# Patient Record
Sex: Male | Born: 1955 | Race: White | Hispanic: No | Marital: Married | Smoking: Current every day smoker
Health system: Southern US, Community
[De-identification: ages and names within clinical notes are randomized; demographics above are authoritative.]

## PROBLEM LIST (undated history)

## (undated) DIAGNOSIS — I10 Essential (primary) hypertension: Secondary | ICD-10-CM

## (undated) DIAGNOSIS — E119 Type 2 diabetes mellitus without complications: Secondary | ICD-10-CM

## (undated) HISTORY — PX: CHOLECYSTECTOMY: SHX55

---

## 2010-10-04 ENCOUNTER — Emergency Department (HOSPITAL_BASED_OUTPATIENT_CLINIC_OR_DEPARTMENT_OTHER)
Admission: EM | Admit: 2010-10-04 | Discharge: 2010-10-04 | Disposition: A | Discharge status: Home / Self Care | Payer: BC Managed Care – PPO | Attending: Emergency Medicine | Admitting: Emergency Medicine

## 2010-10-04 ENCOUNTER — Emergency Department (INDEPENDENT_AMBULATORY_CARE_PROVIDER_SITE_OTHER): Payer: BC Managed Care – PPO

## 2010-10-04 DIAGNOSIS — F29 Unspecified psychosis not due to a substance or known physiological condition: Secondary | ICD-10-CM

## 2010-10-04 DIAGNOSIS — R059 Cough, unspecified: Secondary | ICD-10-CM

## 2010-10-04 DIAGNOSIS — R05 Cough: Secondary | ICD-10-CM

## 2010-10-04 DIAGNOSIS — Z79899 Other long term (current) drug therapy: Secondary | ICD-10-CM | POA: Insufficient documentation

## 2010-10-04 DIAGNOSIS — I10 Essential (primary) hypertension: Secondary | ICD-10-CM | POA: Insufficient documentation

## 2010-10-04 DIAGNOSIS — R5381 Other malaise: Secondary | ICD-10-CM | POA: Insufficient documentation

## 2010-10-04 DIAGNOSIS — E119 Type 2 diabetes mellitus without complications: Secondary | ICD-10-CM | POA: Insufficient documentation

## 2010-10-04 LAB — COMPREHENSIVE METABOLIC PANEL
ALT: 45 U/L (ref 0–53)
Albumin: 4.4 g/dL (ref 3.5–5.2)
Calcium: 9 mg/dL (ref 8.4–10.5)
Glucose, Bld: 189 mg/dL — ABNORMAL HIGH (ref 70–99)
Potassium: 3.8 mEq/L (ref 3.5–5.1)
Sodium: 139 mEq/L (ref 135–145)
Total Protein: 7.5 g/dL (ref 6.0–8.3)

## 2010-10-04 LAB — POCT CARDIAC MARKERS
Myoglobin, poc: 72.8 ng/mL (ref 12–200)
Myoglobin, poc: 64.9 ng/mL (ref 12–200)
Troponin i, poc: <0.05 ng/mL (ref 0.00–0.09)
Troponin i, poc: <0.05 ng/mL (ref 0.00–0.09)

## 2010-10-04 LAB — CBC
MCV: 92.3 fL (ref 78.0–100.0)
Platelets: 190 10*3/uL (ref 150–400)
RBC: 4.93 MIL/uL (ref 4.22–5.81)
RDW: 12 % (ref 11.5–15.5)
WBC: 9 10*3/uL (ref 4.0–10.5)

## 2010-10-04 LAB — URINALYSIS, ROUTINE W REFLEX MICROSCOPIC
Bilirubin Urine: NEGATIVE
Glucose, UA: 250 mg/dL — AB
Ketones, ur: NEGATIVE mg/dL
Protein, ur: NEGATIVE mg/dL
pH: 7 (ref 5.0–8.0)

## 2010-10-04 LAB — GLUCOSE, CAPILLARY: Glucose-Capillary: 195 mg/dL — ABNORMAL HIGH (ref 70–99)

## 2010-10-04 LAB — LIPASE, BLOOD: Lipase: 115 U/L (ref 23–300)

## 2010-10-04 LAB — HEMOCCULT GUIAC POC 1CARD (OFFICE): Fecal Occult Bld: NEGATIVE

## 2011-09-17 IMAGING — CR DG CHEST 2V
2 series · 2 of 2 positions shown · non-contrast
Comparison: None.

CLINICAL DATA: Weakness/cough/confusion

CHEST - 2 VIEW

[w chest pa]
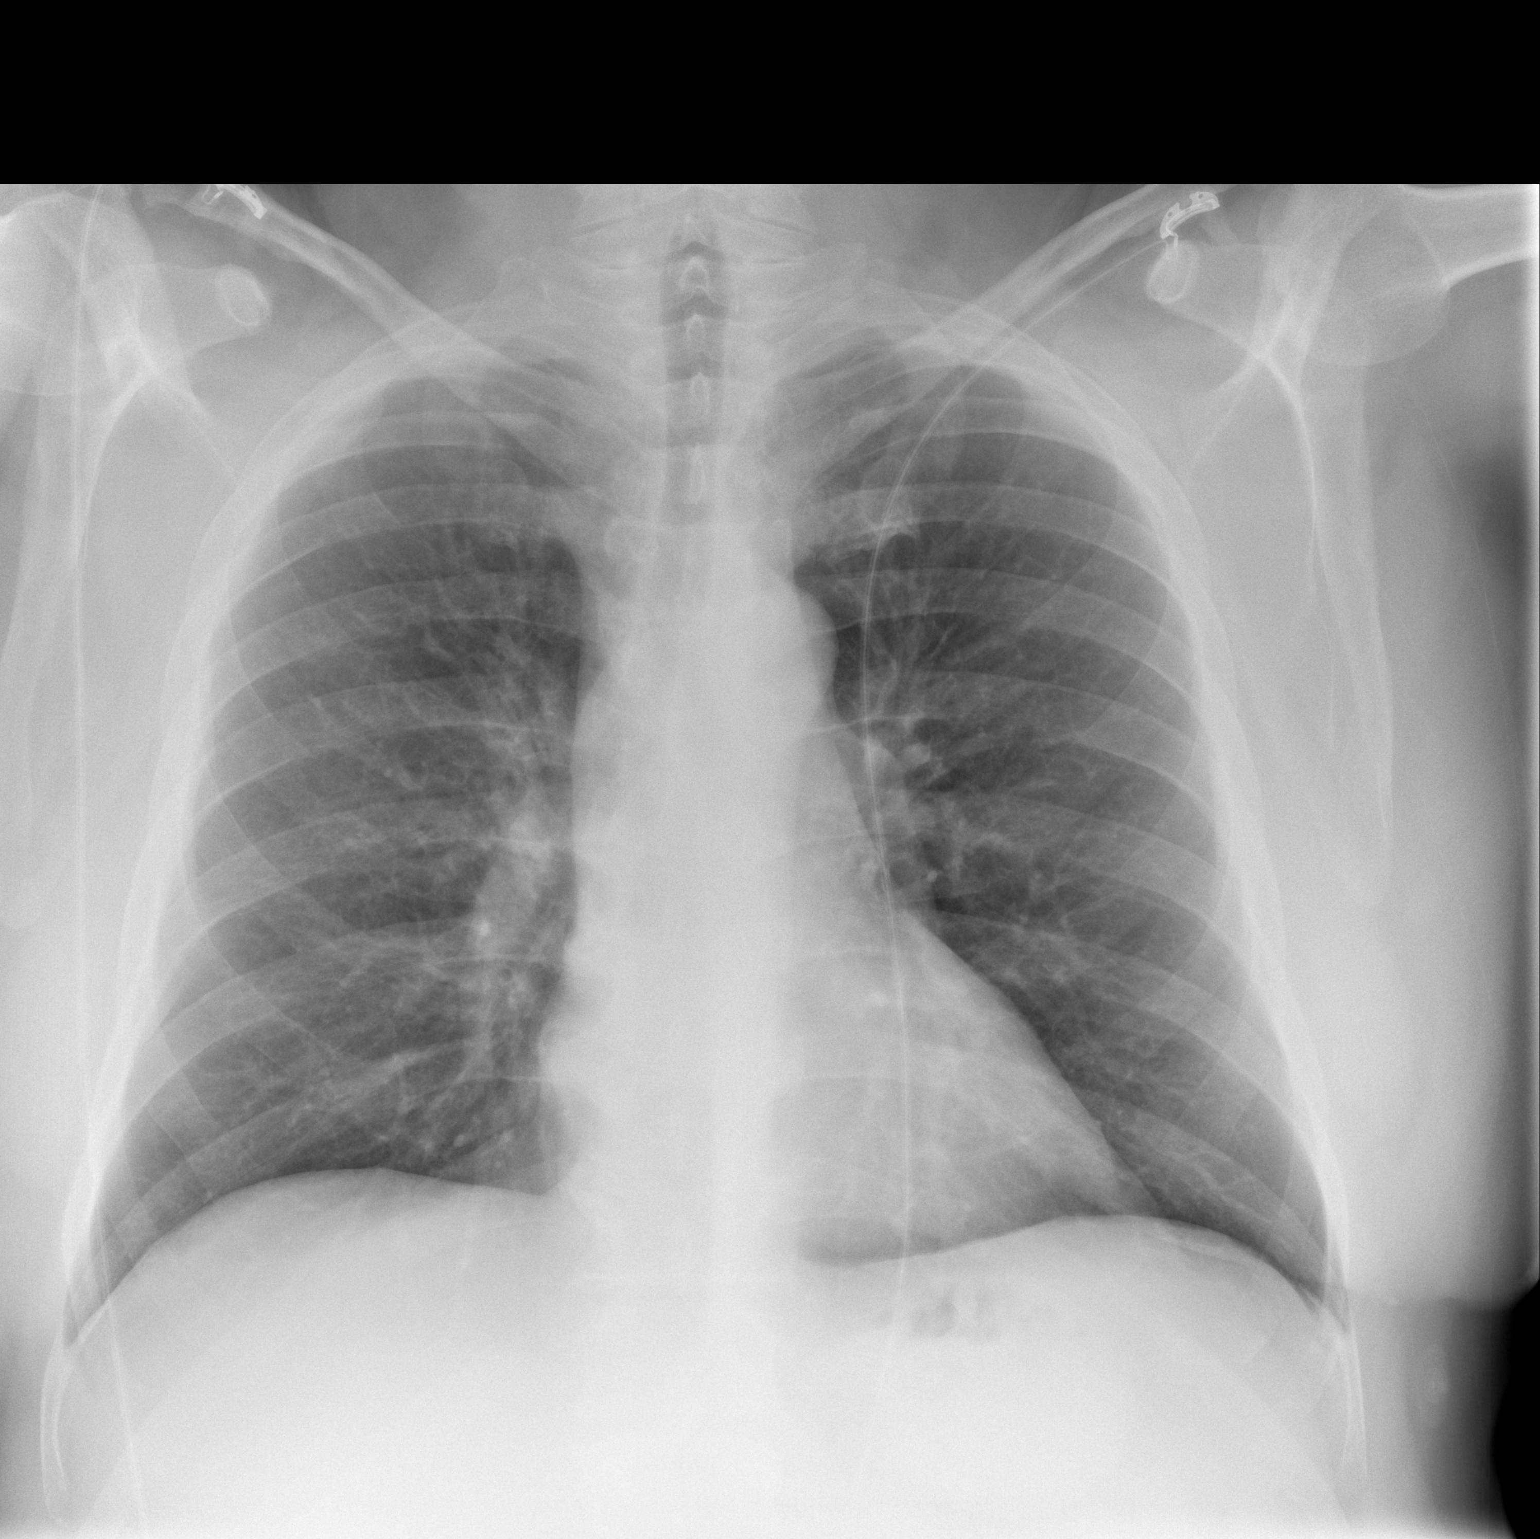

[w chest lat]
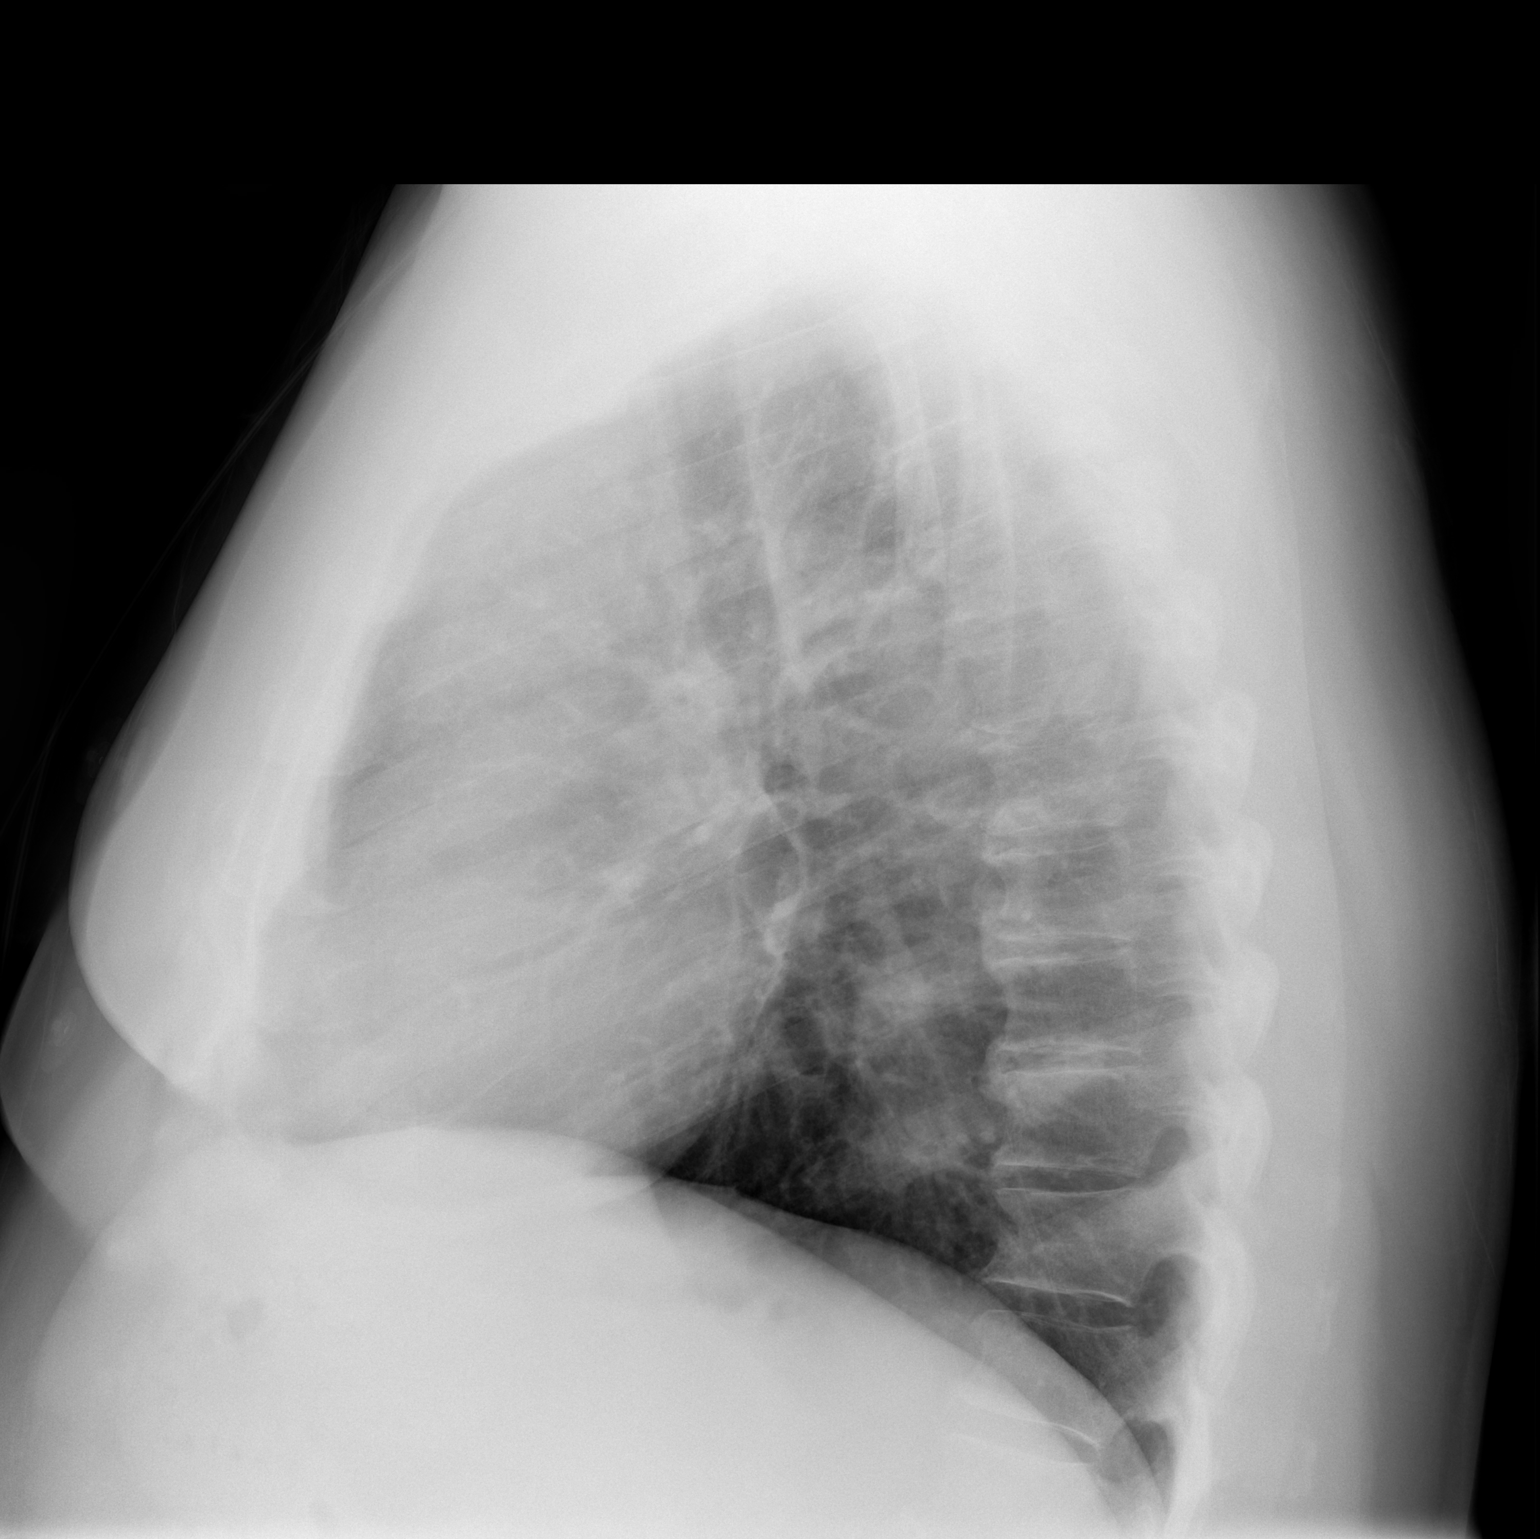

[2 of 2 positions shown; findings below may reference images not displayed]

FINDINGS: Heart and mediastinal contours normal.  Lungs clear.  No
pleural fluid.  Osseous structures and soft tissues unremarkable.

There are moderate degenerative changes of the thoracic spine.
IMPRESSION: No active disease.

## 2021-02-18 ENCOUNTER — Encounter (HOSPITAL_BASED_OUTPATIENT_CLINIC_OR_DEPARTMENT_OTHER): Payer: Self-pay | Admitting: Emergency Medicine

## 2021-02-18 ENCOUNTER — Other Ambulatory Visit: Payer: Self-pay

## 2021-02-18 ENCOUNTER — Emergency Department (HOSPITAL_BASED_OUTPATIENT_CLINIC_OR_DEPARTMENT_OTHER)
Admission: EM | Admit: 2021-02-18 | Discharge: 2021-02-18 | Disposition: A | Discharge status: Home / Self Care | Payer: Medicare Other | Attending: Emergency Medicine | Admitting: Emergency Medicine

## 2021-02-18 DIAGNOSIS — R059 Cough, unspecified: Secondary | ICD-10-CM | POA: Insufficient documentation

## 2021-02-18 DIAGNOSIS — E119 Type 2 diabetes mellitus without complications: Secondary | ICD-10-CM | POA: Insufficient documentation

## 2021-02-18 DIAGNOSIS — R062 Wheezing: Secondary | ICD-10-CM | POA: Insufficient documentation

## 2021-02-18 DIAGNOSIS — F17210 Nicotine dependence, cigarettes, uncomplicated: Secondary | ICD-10-CM | POA: Insufficient documentation

## 2021-02-18 DIAGNOSIS — Z20822 Contact with and (suspected) exposure to covid-19: Secondary | ICD-10-CM | POA: Insufficient documentation

## 2021-02-18 DIAGNOSIS — I10 Essential (primary) hypertension: Secondary | ICD-10-CM | POA: Diagnosis not present

## 2021-02-18 HISTORY — DX: Type 2 diabetes mellitus without complications: E11.9

## 2021-02-18 HISTORY — DX: Essential (primary) hypertension: I10

## 2021-02-18 NOTE — ED Triage Notes (Signed)
Pt sts wife tested + for Covid today; sts he has a dry cough and wants to be tested as well

## 2021-02-18 NOTE — Discharge Instructions (Signed)
Please read and follow all provided instructions.  Your diagnoses today include:  1. Cough with exposure to COVID-19 virus     Tests performed today include: Vital signs. See below for your results today.  COVID test - pending, check mychart for results  Medications prescribed:  None  Take any prescribed medications only as directed. Treatment for your infection is aimed at treating the symptoms. There are no medications, such as antibiotics, that will cure your infection.   Home care instructions:  Follow any educational materials contained in this packet.   Your illness is contagious and can be spread to others, especially during the first 3 or 4 days. It cannot be cured by antibiotics or other medicines. Take basic precautions such as washing your hands often, covering your mouth when you cough or sneeze, and avoiding public places where you could spread your illness to others.   Please continue drinking plenty of fluids.  Use over-the-counter medicines as needed as directed on packaging for symptom relief.  You may also use ibuprofen or tylenol as directed on packaging for pain or fever.  Do not take multiple medicines containing Tylenol or acetaminophen to avoid taking too much of this medication.  If you are positive for Covid-19, you should isolate yourself and not be exposed to other people for 5 days after your symptoms began. If you are not feeling better at day 5, you need to isolate yourself for a total of 10 days. If you are feeling better by day 5, you should wear a mask properly, over your nose and mouth, at all times while around other people until 10 days after your symptoms started.   Follow-up instructions: Please follow-up with your primary care provider as needed for further evaluation of your symptoms if you are not feeling better.   I will call in a prescription for the antiviral medication Paxlovid assuming your test is positive.   Return instructions:  Please  return to the Emergency Department if you experience worsening symptoms.  Return to the emergency department if you have worsening shortness of breath breathing or increased work of breathing, persistent vomiting RETURN IMMEDIATELY IF you develop shortness of breath, confusion or altered mental status, a new rash, become dizzy, faint, or poorly responsive, or are unable to be cared for at home. Please return if you have persistent vomiting and cannot keep down fluids or develop a fever that is not controlled by tylenol or motrin.   Please return if you have any other emergent concerns.  Additional Information:  Your vital signs today were: BP (!) 145/74   Pulse 87   Temp 98.5 F (36.9 C) (Oral)   Resp 20   Ht 6' (1.829 m)   Wt 131.5 kg   SpO2 97%   BMI 39.33 kg/m  If your blood pressure (BP) was elevated above 135/85 this visit, please have this repeated by your doctor within one month. --------------

## 2021-02-18 NOTE — ED Provider Notes (Signed)
[facility] EMERGENCY DEPARTMENT Provider Note   CSN: [id number] Arrival date & time: 02/18/21  1723     History Chief Complaint  Patient presents with   Cough    Brian Meyer is a 65 y.o. male.  Patient presents the emergency department for evaluation of cough.  Patient has been exposed to his wife who developed a fever and tested positive for COVID today on an at home test.  He reports dry, nonproductive cough.  He attributed this to smoking.  States he has a history of diabetes for which she takes a medication.  He states that the remainder of his lab work performed by his PCP recently looked good.  He does not have a history of chronic kidney disease per his report.  No fevers, sore throat, vomiting or diarrhea.        Past Medical History:  Diagnosis Date   Diabetes mellitus without complication (HCC)    Hypertension     There are no problems to display for this patient.   Past Surgical History:  Procedure Laterality Date   CHOLECYSTECTOMY         No family history on file.  Social History   Tobacco Use   Smoking status: Every Day    Types: Cigarettes   Smokeless tobacco: Never    Home Medications Prior to Admission medications   Not on File    Allergies    Patient has no known allergies.  Review of Systems   Review of Systems  Constitutional:  Negative for chills, fatigue and fever.  HENT:  Negative for congestion, ear pain, rhinorrhea, sinus pressure and sore throat.   Eyes:  Negative for redness.  Respiratory:  Positive for cough. Negative for shortness of breath and wheezing.   Gastrointestinal:  Negative for abdominal pain, diarrhea, nausea and vomiting.  Genitourinary:  Negative for dysuria.  Musculoskeletal:  Negative for myalgias and neck stiffness.  Skin:  Negative for rash.  Neurological:  Negative for headaches.  Hematological:  Negative for adenopathy.   Physical Exam Updated Vital Signs BP (!) 145/74   Pulse 87   Temp  98.5 F (36.9 C) (Oral)   Resp 20   Ht 6' (1.829 m)   Wt 131.5 kg   SpO2 97%   BMI 39.33 kg/m   Physical Exam Vitals and nursing note reviewed.  Constitutional:      Appearance: He is well-developed.  HENT:     Head: Normocephalic and atraumatic.     Jaw: No trismus.     Right Ear: External ear normal.     Left Ear: External ear normal.     Nose: Nose normal. No mucosal edema or rhinorrhea.     Mouth/Throat:     Mouth: Mucous membranes are not dry.     Pharynx: Uvula midline. No oropharyngeal exudate, posterior oropharyngeal erythema or uvula swelling.     Tonsils: No tonsillar abscesses.  Eyes:     General:        Right eye: No discharge.        Left eye: No discharge.     Conjunctiva/sclera: Conjunctivae normal.  Cardiovascular:     Rate and Rhythm: Normal rate and regular rhythm.  Pulmonary:     Effort: Pulmonary effort is normal. No respiratory distress.     Breath sounds: Wheezing (minimal scattered wheezing) present. No rales.  Abdominal:     Palpations: Abdomen is soft.     Tenderness: There is no abdominal tenderness.  Musculoskeletal:  Cervical back: Normal range of motion and neck supple.  Skin:    General: Skin is warm and dry.  Neurological:     Mental Status: He is alert.    ED Results / Procedures / Treatments   Labs (all labs ordered are listed, but only abnormal results are displayed) Labs Reviewed  SARS CORONAVIRUS 2 (TAT 6-24 HRS)    EKG None  Radiology No results found.  Procedures Procedures   Medications Ordered in ED Medications - No data to display  ED Course  I have reviewed the triage vital signs and the nursing notes.  Pertinent labs & imaging results that were available during my care of the patient were reviewed by me and considered in my medical decision making (see chart for details).  Patient seen and examined. COVID test -- will follow result. Discussed antiviral. He has higher-risk, DM, smoking, age > 27. Will rx  if positive.   Vital signs reviewed and are as follows: BP (!) 145/74   Pulse 87   Temp 98.5 F (36.9 C) (Oral)   Resp 20   Ht 6' (1.829 m)   Wt 131.5 kg   SpO2 97%   BMI 39.33 kg/m   Brian Meyer was evaluated in Emergency Department on 02/18/2021 for the symptoms described in the history of present illness. He was evaluated in the context of the global COVID-19 pandemic, which necessitated consideration that the patient might be at risk for infection with the SARS-CoV-2 virus that causes COVID-19. Institutional protocols and algorithms that pertain to the evaluation of patients at risk for COVID-19 are in a state of rapid change based on information released by regulatory bodies including the CDC and federal and state organizations. These policies and algorithms were followed during the patient's care in the ED.    [facility] Rules/Calculators/A&P                           COVID pending, + exposure. Asymptomatic other than cough.     Final Clinical Impression(s) / ED Diagnoses Final diagnoses:  Cough with exposure to COVID-19 virus    Rx / DC Orders ED Discharge Orders     None        Renne Crigler, PA-C 02/18/21 Alisia Ferrari, MD 02/18/21 [phone]

## 2021-02-19 LAB — SARS CORONAVIRUS 2 (TAT 6-24 HRS): SARS Coronavirus 2: NEGATIVE
